# Patient Record
Sex: Male | Born: 1960 | Race: White | Hispanic: No | Marital: Married | Smoking: Never smoker
Health system: Southern US, Community
[De-identification: ages and names within clinical notes are randomized; demographics above are authoritative.]

## PROBLEM LIST (undated history)

## (undated) DIAGNOSIS — Z8546 Personal history of malignant neoplasm of prostate: Secondary | ICD-10-CM

## (undated) DIAGNOSIS — D582 Other hemoglobinopathies: Secondary | ICD-10-CM

## (undated) DIAGNOSIS — E7800 Pure hypercholesterolemia, unspecified: Secondary | ICD-10-CM

## (undated) DIAGNOSIS — R002 Palpitations: Secondary | ICD-10-CM

## (undated) HISTORY — DX: Palpitations: R00.2

## (undated) HISTORY — DX: Pure hypercholesterolemia, unspecified: E78.00

## (undated) HISTORY — PX: KIDNEY STONE SURGERY: SHX686

## (undated) HISTORY — DX: Other hemoglobinopathies: D58.2

## (undated) HISTORY — DX: Personal history of malignant neoplasm of prostate: Z85.46

---

## 2019-10-12 ENCOUNTER — Emergency Department
Admission: EM | Admit: 2019-10-12 | Discharge: 2019-10-12 | Disposition: A | Discharge status: Home / Self Care | Payer: BC Managed Care – PPO | Source: Home / Self Care

## 2019-10-12 ENCOUNTER — Emergency Department (INDEPENDENT_AMBULATORY_CARE_PROVIDER_SITE_OTHER): Payer: BC Managed Care – PPO

## 2019-10-12 DIAGNOSIS — S82832A Other fracture of upper and lower end of left fibula, initial encounter for closed fracture: Secondary | ICD-10-CM

## 2019-10-12 DIAGNOSIS — W1843XA Slipping, tripping and stumbling without falling due to stepping from one level to another, initial encounter: Secondary | ICD-10-CM | POA: Diagnosis not present

## 2019-10-12 NOTE — Discharge Instructions (Signed)
You should wear the boot at all times and remain non-weightbearing using the crutches.  You can briefly remove the boot while relaxing at home, elevating foot and applying a cool compress to help with pain and swelling. And you can remove the boot to bath, do no try to stand as you will risk falling and causing more injury to your ankle.  Call today or tomorrow to schedule an appointment with Sports Medicine next week for further evaluation and treatment of symptoms.

## 2019-10-12 NOTE — ED Provider Notes (Signed)
[facility]    CSN: [id number] Arrival date & time: 10/12/19  1406      History   Chief Complaint Chief Complaint  Patient presents with  . Ankle Pain    LT    HPI Travis Mueller is a 59 y.o. male.   HPI Travis Mueller is a 59 y.o. male presenting to UC with c/o sudden onset Left ankle pain and swelling that started just PTA while mis stepping over a curb while at work at end of shift today. Pt works for [employer].  He has been there about 2 weeks. He notified work but states they did not give him any worker's comp paperwork.  Pain is aching and sore, 6/10, worse with weightbearing and movement. No prior fracture or surgery to same ankle. No other injuries from the stumble.   History reviewed. No pertinent past medical history.  There are no problems to display for this patient.   History reviewed. No pertinent surgical history.     Home Medications    Prior to Admission medications   Medication Sig Start Date End Date Taking? Authorizing Provider  omeprazole (PRILOSEC) 20 MG capsule Take 20 mg by mouth daily.   Yes [provider]  simvastatin (ZOCOR) 20 MG tablet Take 20 mg by mouth daily.   Yes [provider]    Family History History reviewed. No pertinent family history.  Social History Social History   Tobacco Use  . Smoking status: Never Smoker  . Smokeless tobacco: Never Used  Vaping Use  . Vaping Use: Never used  Substance Use Topics  . Alcohol use: Not Currently  . Drug use: Not on file     Allergies   Patient has no known allergies.   Review of Systems Review of Systems  Musculoskeletal: Positive for arthralgias, gait problem and joint swelling.  Skin: Negative for color change and wound.  Neurological: Negative for weakness and numbness.     Physical Exam Triage Vital Signs ED Triage Vitals  Enc Vitals Group     BP 10/12/19 1422 (!) 143/76     Pulse Rate 10/12/19 1422 81     Resp 10/12/19 1422 17      Temp 10/12/19 1422 99 F (37.2 C)     Temp Source 10/12/19 1422 Oral     SpO2 10/12/19 1422 97 %     Weight --      Height --      Head Circumference --      Peak Flow --      Pain Score 10/12/19 1423 6     Pain Loc --      Pain Edu? --      Excl. in GC? --    No data found.  Updated Vital Signs BP (!) 143/76 (BP Location: Left Arm)   Pulse 81   Temp 99 F (37.2 C) (Oral)   Resp 17   SpO2 97%   Visual Acuity Right Eye Distance:   Left Eye Distance:   Bilateral Distance:    Right Eye Near:   Left Eye Near:    Bilateral Near:     Physical Exam Vitals and nursing note reviewed.  Constitutional:      Appearance: Normal appearance. He is well-developed.  HENT:     Head: Normocephalic and atraumatic.  Cardiovascular:     Rate and Rhythm: Normal rate and regular rhythm.     Pulses:          Dorsalis pedis  pulses are 2+ on the left side.       Posterior tibial pulses are 2+ on the left side.  Pulmonary:     Effort: Pulmonary effort is normal.  Musculoskeletal:        General: Swelling and tenderness present.     Cervical back: Normal range of motion.     Comments: Left ankle: moderate edema over lateral malleolus. Tenderness. Slight decreased dorsiflexion and plantarflexion due to pain.  Calf is soft, non-tender, full ROM knee. No tenderness to foot.   Skin:    General: Skin is warm and dry.     Capillary Refill: Capillary refill takes less than 2 seconds.     Findings: No bruising or erythema.  Neurological:     Mental Status: He is alert and oriented to person, place, and time.     Sensory: No sensory deficit.  Psychiatric:        Behavior: Behavior normal.      UC Treatments / Results  Labs (all labs ordered are listed, but only abnormal results are displayed) Labs Reviewed - No data to display  EKG   Radiology DG Ankle Complete Left  Result Date: 10/12/2019 CLINICAL DATA:  59 year old male twisted left ankle today at work. Complains of  lateral ankle pain. EXAM: LEFT ANKLE COMPLETE - 3+ VIEW COMPARISON:  None. FINDINGS: Transsyndesmotic, oblique, minimally displaced fracture of the distal fibula. No other acute fracture of the bones of the left ankle. No dislocation. No widening of the medial or lateral clear spaces. Small associated left ankle effusion. Associated subcutaneus soft tissue edema of the lateral left ankle. IMPRESSION: Transsyndesmotic, oblique, minimally displaced fracture of the distal fibula. Electronically Signed   By: Tish Frederickson M.D.   On: 10/12/2019 15:19    Procedures Procedures (including critical care time)  Medications Ordered in UC Medications - No data to display  Initial Impression / Assessment and Plan / UC Course  I have reviewed the triage vital signs and the nursing notes.  Pertinent labs & imaging results that were available during my care of the patient were reviewed by me and considered in my medical decision making (see chart for details).     Discussed imaging with pt  Pt placed in walking boot and provided crutches, advised to remain non-weightbearing until f/u with sports medicine next week AVS and work note provided  Final Clinical Impressions(s) / UC Diagnoses   Final diagnoses:  Other closed fracture of distal end of left fibula, initial encounter     Discharge Instructions     You should wear the boot at all times and remain non-weightbearing using the crutches.  You can briefly remove the boot while relaxing at home, elevating foot and applying a cool compress to help with pain and swelling. And you can remove the boot to bath, do no try to stand as you will risk falling and causing more injury to your ankle.  Call today or tomorrow to schedule an appointment with Sports Medicine next week for further evaluation and treatment of symptoms.     ED Prescriptions    None     PDMP not reviewed this encounter.   Lurene Shadow, [place] 10/12/19 1558

## 2019-10-12 NOTE — ED Triage Notes (Signed)
Pt c/o LT ankle pain after misstepping at end of shift today. Swelling and bruising noted. Advil an hour ago.

## 2019-12-12 DIAGNOSIS — E782 Mixed hyperlipidemia: Secondary | ICD-10-CM | POA: Insufficient documentation

## 2020-02-08 ENCOUNTER — Other Ambulatory Visit: Payer: BC Managed Care – PPO

## 2020-02-08 DIAGNOSIS — Z20822 Contact with and (suspected) exposure to covid-19: Secondary | ICD-10-CM

## 2020-02-13 LAB — NOVEL CORONAVIRUS, NAA: SARS-CoV-2, NAA: DETECTED — AB

## 2021-01-25 NOTE — Progress Notes (Signed)
Cardiology Office Note:    Date:  01/28/2021   ID:  Travis Mueller, DOB October 15, 1960, MRN [id number]  PCP:  Oneita Hurt No   [facility] Providers Cardiologist:  Alverda Skeans, MD Referring MD: Shon Hale, *   Chief Complaint/Reason for Referral: Palpitations  ASSESSMENT:    Palpitations  Syncope and collapse    PLAN:    In order of problems listed above:  1.  We will obtain echocardiogram and monitor to evaluate further.  We will keep follow-up open-ended depending on the results of this testing.  It is possible that given his nausea this may have represented a vasovagal episode.  2.  The fact that the patient had nausea associated with this episode makes me suspicious of vasovagal syncope.  We will complete the work-up as above and if this is negative then I think this is likely the diagnosis.     Dispo:  No follow-ups on file.     Medication Adjustments/Labs and Tests Ordered: Current medicines are reviewed at length with the patient today.  Concerns regarding medicines are outlined above.   Tests Ordered: No orders of the defined types were placed in this encounter.   Medication Changes: No orders of the defined types were placed in this encounter.   History of Present Illness:    The patient is a 60 y.o. male with the indicated medical history here for recommendations regarding palpitations.  The patient was seen by his primary care provider recently.  Apparently had an episode of syncope with complete loss of consciousness about 3 weeks ago.  He had preceding nausea as well as palpitations associated with this episode.  His primary care provider asked him to monitor his rhythm on his apple watch.  Additionally labs were drawn but I do not have those results.  The patient tells me that on the day of his syncope he was going to the bathroom because of the nausea.  He then blacked out.  He hit his head on the toilet.  He then came to and as he was walking he  had another episode of syncope.  He did not seek urgent medical attention.  He works at [employer] and does quite strenuous labor by lifting boxes.  He denies any significant angina.  He occasionally gets short of breath but this is not on a regular basis.  He denies any paroxysmal nocturnal dyspnea, orthopnea, other episodes of syncope recently or peripheral edema.  He has had no bleeding or bruising or any signs or symptoms of stroke.    Previous Medical History: Past Medical History:  Diagnosis Date   Elevated hemoglobin (HCC)    History of prostate cancer    Hypercholesterolemia    Palpitations      Current Medications: Current Meds  Medication Sig   omeprazole (PRILOSEC) 20 MG capsule Take 20 mg by mouth daily.   simvastatin (ZOCOR) 20 MG tablet Take 20 mg by mouth daily.     Allergies:    Patient has no known allergies.   Social History:   Social History   Tobacco Use   Smoking status: Never   Smokeless tobacco: Never  Vaping Use   Vaping Use: Never used  Substance Use Topics   Alcohol use: Not Currently     Family Hx: Family History  Problem Relation Age of Onset   Healthy Mother    Hypertension Father    Cancer - Prostate Father    Cancer - Prostate Maternal Aunt  Hypertension Maternal Aunt    Healthy Daughter    Healthy Son      Review of Systems:   Please see the history of present illness.    All other systems reviewed and are negative.  EKGs/Labs/Other Test Reviewed:    EKG:  EKG today: Sinus rhythm; prior EKG: None available  Prior CV studies: None available    Imaging studies that I have independently reviewed today: None available  Recent Labs: No results found for requested labs within last 8760 hours.   Recent Lipid Panel No results found for: CHOL, TRIG, HDL, LDLCALC, LDLDIRECT  Risk Assessment/Calculations:           Physical Exam:    VS:  BP 128/80    Pulse 75    Ht 5\' 6"  (1.676 m)    Wt 150 lb 3.2 oz (68.1 kg)    SpO2 98%     BMI 24.24 kg/m    Wt Readings from Last 3 Encounters:  01/28/21 150 lb 3.2 oz (68.1 kg)    GENERAL:  No apparent distress, AOx3 HEENT:  No carotid bruits, +2 carotid impulses, no scleral icterus CAR: RRR  no murmurs, gallops, rubs, or thrills RES:  Clear to auscultation bilaterally ABD:  Soft, nontender, nondistended, positive bowel sounds x 4 VASC:  +2 radial pulses, +2 carotid pulses, palpable pedal pulses NEURO:  CN 2-12 grossly intact; motor and sensory grossly intact PSYCH:  No active depression or anxiety EXT:  No edema, ecchymosis, or cyanosis  Signed, 03/28/21, MD  01/28/2021 3:46 PM    [facility] [address] [place], [place], [place]  [place] Phone: [phone]; Fax: [phone]   Note:  This document was prepared using Dragon voice recognition software and may include unintentional dictation errors.

## 2021-01-28 ENCOUNTER — Encounter: Payer: Self-pay | Admitting: Internal Medicine

## 2021-01-28 ENCOUNTER — Ambulatory Visit (INDEPENDENT_AMBULATORY_CARE_PROVIDER_SITE_OTHER): Payer: Managed Care, Other (non HMO) | Admitting: Internal Medicine

## 2021-01-28 ENCOUNTER — Ambulatory Visit (INDEPENDENT_AMBULATORY_CARE_PROVIDER_SITE_OTHER): Payer: Managed Care, Other (non HMO)

## 2021-01-28 ENCOUNTER — Other Ambulatory Visit: Payer: Self-pay

## 2021-01-28 VITALS — BP 128/80 | HR 75 | Ht 66.0 in | Wt 150.2 lb

## 2021-01-28 DIAGNOSIS — R002 Palpitations: Secondary | ICD-10-CM

## 2021-01-28 DIAGNOSIS — R55 Syncope and collapse: Secondary | ICD-10-CM | POA: Diagnosis not present

## 2021-01-28 NOTE — Patient Instructions (Signed)
Medication Instructions:  Your physician recommends that you continue on your current medications as directed. Please refer to the Current Medication list given to you today.  *If you need a refill on your cardiac medications before your next appointment, please call your pharmacy*   Lab Work: None today If you have labs (blood work) drawn today and your tests are completely normal, you will receive your results only by: MyChart Message (if you have MyChart) OR A paper copy in the mail If you have any lab test that is abnormal or we need to change your treatment, we will call you to review the results.   Testing/Procedures: Your physician has requested that you have an echocardiogram. Echocardiography is a painless test that uses sound waves to create images of your heart. It provides your doctor with information about the size and shape of your heart and how well your hearts chambers and valves are working. This procedure takes approximately one hour. There are no restrictions for this procedure.    Follow-Up: At [facility], you and your health needs are our priority.  As part of our continuing mission to provide you with exceptional heart care, we have created designated Provider Care Teams.  These Care Teams include your primary Cardiologist (physician) and Advanced Practice Providers (APPs -  Physician Assistants and Nurse Practitioners) who all work together to provide you with the care you need, when you need it.  We recommend signing up for the patient portal called "MyChart".  Sign up information is provided on this After Visit Summary.  MyChart is used to connect with patients for Virtual Visits (Telemedicine).  Patients are able to view lab/test results, encounter notes, upcoming appointments, etc.  Non-urgent messages can be sent to your provider as well.   To learn more about what you can do with MyChart, go to [email or web address].    Your next appointment:   Follow up  as needed.   Provider:   Orbie Pyo, MD  Other Instructions  Travis Mueller- Long Term Monitor Instructions  Your physician has requested you wear a ZIO patch monitor for 7 days.  This is a single patch monitor. [employer] supplies one patch monitor per enrollment. Additional stickers are not available. Please do not apply patch if you will be having a Nuclear Stress Test,  Echocardiogram, Cardiac CT, MRI, or Chest Xray during the period you would be wearing the  monitor. The patch cannot be worn during these tests. You cannot remove and re-apply the  ZIO XT patch monitor.  Your ZIO patch monitor will be mailed 3 day USPS to your address on file. It may take 3-5 days  to receive your monitor after you have been enrolled.  Once you have received your monitor, please review the enclosed instructions. Your monitor  has already been registered assigning a specific monitor serial # to you.  Billing and Patient Assistance Program Information  We have supplied [employer] with any of your insurance information on file for billing purposes. [employer] offers a sliding scale Patient Assistance Program for patients that do not have  insurance, or whose insurance does not completely cover the cost of the ZIO monitor.  You must apply for the Patient Assistance Program to qualify for this discounted rate.  To apply, please call [employer] at [phone], select option 4, select option 2, ask to apply for  Patient Assistance Program. Meredeth Ide will ask your household income, and how many people  are in your household. They will quote your  out-of-pocket cost based on that information.  [employer] will also be able to set up a 48-month, interest-free payment plan if needed.  Applying the monitor   Shave hair from upper left chest.  Hold abrader disc by orange tab. Rub abrader in 40 strokes over the upper left chest as  indicated in your monitor instructions.  Clean area with 4 enclosed alcohol pads. Let dry.  Apply  patch as indicated in monitor instructions. Patch will be placed under collarbone on left  side of chest with arrow pointing upward.  Rub patch adhesive wings for 2 minutes. Remove white label marked "1". Remove the white  label marked "2". Rub patch adhesive wings for 2 additional minutes.  While looking in a mirror, press and release button in center of patch. A small green light will  flash 3-4 times. This will be your only indicator that the monitor has been turned on.  Do not shower for the first 24 hours. You may shower after the first 24 hours.  Press the button if you feel a symptom. You will hear a small click. Record Date, Time and  Symptom in the Patient Logbook.  When you are ready to remove the patch, follow instructions on the last 2 pages of Patient  Logbook. Stick patch monitor onto the last page of Patient Logbook.  Place Patient Logbook in the blue and white box. Use locking tab on box and tape box closed  securely. The blue and white box has prepaid postage on it. Please place it in the mailbox as  soon as possible. Your physician should have your test results approximately 7 days after the  monitor has been mailed back to [facility].  Call [facility] Customer Care at [phone] if you have questions regarding  your ZIO XT patch monitor. Call them immediately if you see an orange light blinking on your  monitor.  If your monitor falls off in less than 4 days, contact our Monitor department at [phone].  If your monitor becomes loose or falls off after 4 days call [employer] at [phone] for  suggestions on securing your monitor

## 2021-01-28 NOTE — Progress Notes (Unsigned)
[id number] applied in office

## 2021-02-07 NOTE — Progress Notes (Signed)
Let him know monitor shows very rare extra beats from the top and bottom of the heart that were short in duration.  No worrisome rhythm issues.

## 2021-02-12 ENCOUNTER — Ambulatory Visit (HOSPITAL_COMMUNITY): Payer: Managed Care, Other (non HMO) | Attending: Cardiology

## 2021-02-12 ENCOUNTER — Other Ambulatory Visit: Payer: Self-pay

## 2021-02-12 DIAGNOSIS — R002 Palpitations: Secondary | ICD-10-CM | POA: Insufficient documentation

## 2021-02-12 DIAGNOSIS — R0609 Other forms of dyspnea: Secondary | ICD-10-CM

## 2021-02-12 DIAGNOSIS — R55 Syncope and collapse: Secondary | ICD-10-CM | POA: Diagnosis not present

## 2021-02-12 LAB — ECHOCARDIOGRAM COMPLETE
Area-P 1/2: 2.66 cm2
S' Lateral: 3.7 cm

## 2021-07-06 IMAGING — DX DG ANKLE COMPLETE 3+V*L*
3 series · 3 of 3 positions shown · non-contrast
Comparison: None.

CLINICAL DATA: 58-year-old male twisted left ankle today at work.
Complains of lateral ankle pain.

EXAM:
LEFT ANKLE COMPLETE - 3+ VIEW

[ankle ap]
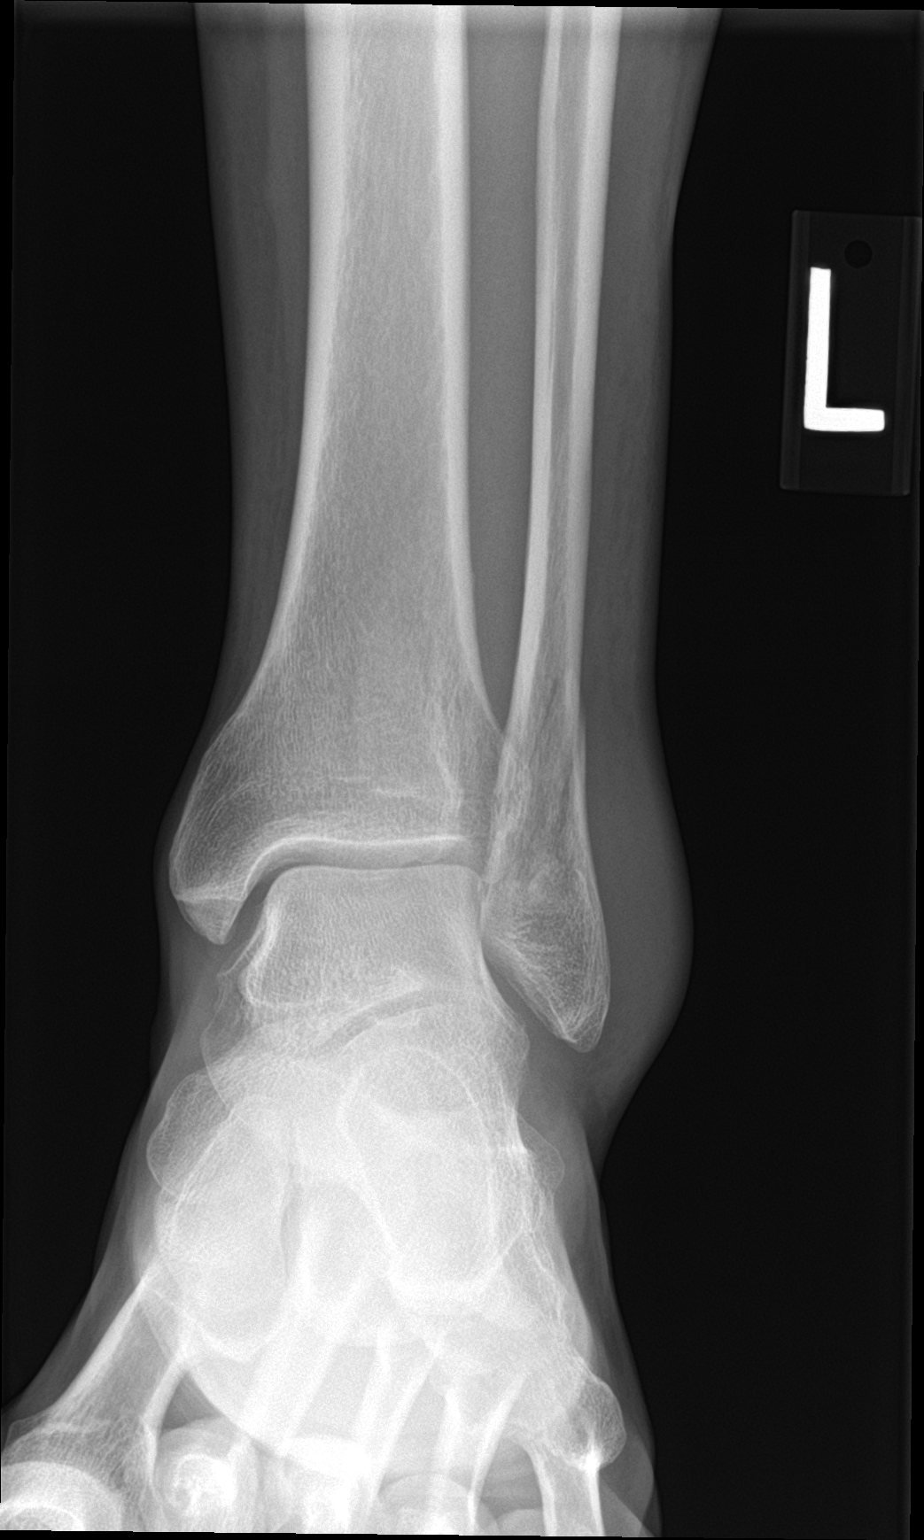

[ankle obl]
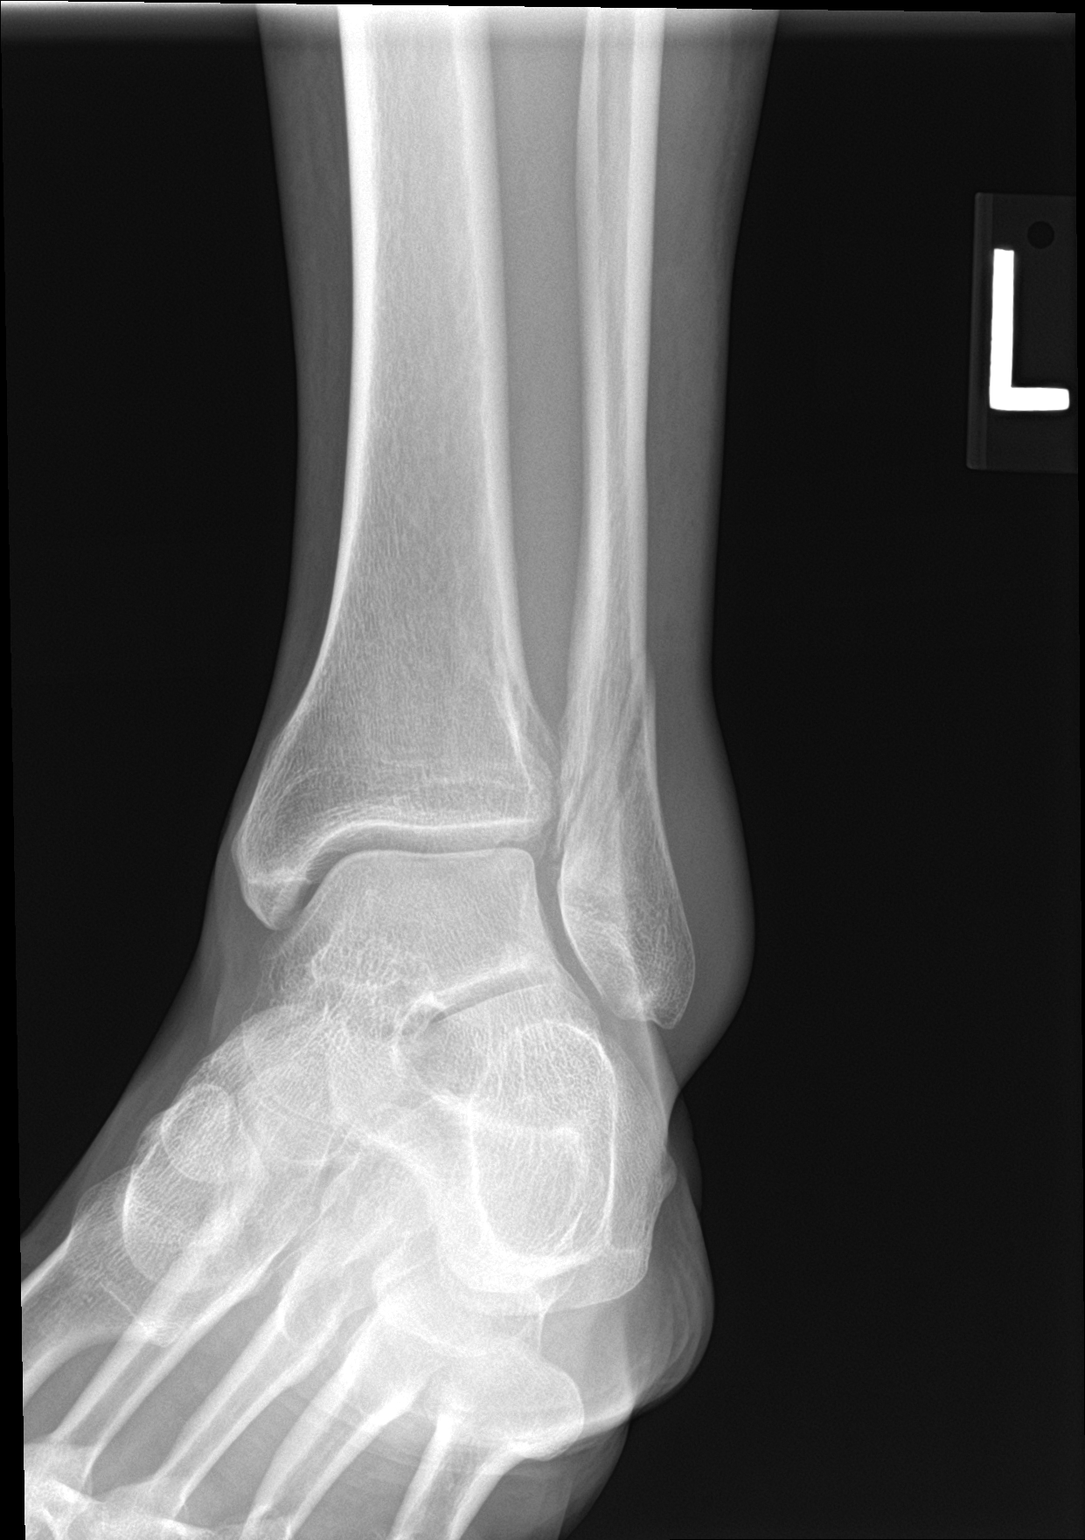

[ankle lat]
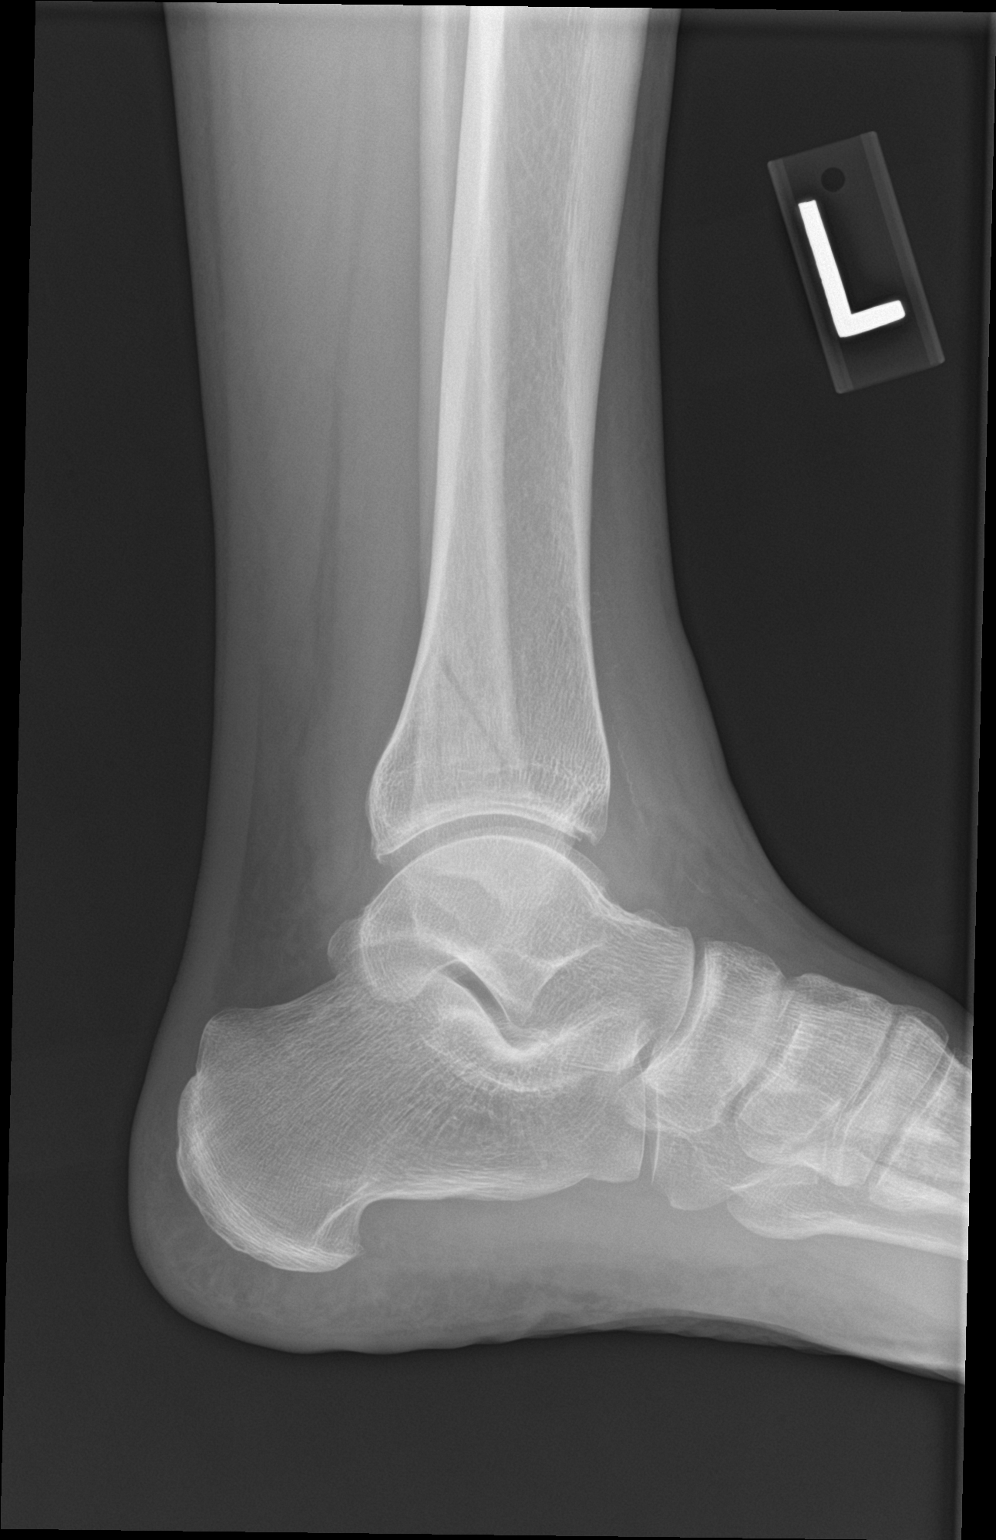

[3 of 3 positions shown; findings below may reference images not displayed]

FINDINGS: Transsyndesmotic, oblique, minimally displaced fracture of the
distal fibula. No other acute fracture of the bones of the left
ankle. No dislocation. No widening of the medial or lateral clear
spaces. Small associated left ankle effusion. Associated subcutaneus
soft tissue edema of the lateral left ankle.
IMPRESSION: Transsyndesmotic, oblique, minimally displaced fracture of the
distal fibula.

## 2022-01-30 ENCOUNTER — Other Ambulatory Visit (HOSPITAL_COMMUNITY): Payer: Self-pay | Admitting: Family Medicine

## 2022-01-30 DIAGNOSIS — E785 Hyperlipidemia, unspecified: Secondary | ICD-10-CM

## 2022-02-04 ENCOUNTER — Ambulatory Visit (HOSPITAL_BASED_OUTPATIENT_CLINIC_OR_DEPARTMENT_OTHER)
Admission: RE | Admit: 2022-02-04 | Discharge: 2022-02-04 | Disposition: A | Discharge status: Home / Self Care | Payer: Self-pay | Source: Ambulatory Visit | Attending: Family Medicine | Admitting: Family Medicine

## 2022-02-04 DIAGNOSIS — E785 Hyperlipidemia, unspecified: Secondary | ICD-10-CM | POA: Insufficient documentation

## 2022-02-06 ENCOUNTER — Other Ambulatory Visit: Payer: Self-pay | Admitting: Family Medicine

## 2022-02-06 DIAGNOSIS — K769 Liver disease, unspecified: Secondary | ICD-10-CM

## 2022-02-11 NOTE — Progress Notes (Addendum)
Cardiology Office Note:    Date:  02/12/2022   ID:  Travis Mueller, DOB [birth year], MRN [id number]  PCP:  Aviva Kluver   [facility] Providers Cardiologist:  Alverda Skeans, MD Referring MD: Shon Hale, *   Chief Complaint/Reason for Referral:  Coronary calcium  ASSESSMENT:    1. Coronary artery calcification seen on CAT scan   2. Hyperlipidemia LDL goal <70     PLAN:    In order of problems listed above:  Coronary calcification:  Start ASA 81mg , continue atorvastatin 80 mg and start zetia 10 mg, and pursue strict blood pressure control. Hyperlipidemia:  Check FLP, LFTs, and Lp(a) in 2 months.             Dispo:  Return in about 1 year (around 02/13/2023).      Medication Adjustments/Labs and Tests Ordered: Current medicines are reviewed at length with the patient today.  Concerns regarding medicines are outlined above.  The following changes have been made:     Labs/tests ordered: Orders Placed This Encounter  Procedures   ALT   Lipid panel   Lipoprotein A (LPA)   EKG 12-Lead    Medication Changes: Meds ordered this encounter  Medications   aspirin EC 81 MG tablet    Sig: Take 1 tablet (81 mg total) by mouth daily. Swallow whole.    Dispense:  30 tablet    Refill:  12     Current medicines are reviewed at length with the patient today.  The patient does not have concerns regarding medicines.   History of Present Illness:    FOCUSED PROBLEM LIST:   Coronary calcification on CAC 2024  The patient is a 62 y.o. male with the indicated medical history here for recommendations regarding coronary calcium seen on a screening CAC.  The patient had been seen a year ago for palpitations and had a reassuring echocardiogram and monitor.  He had a calcium score done which showed elevated amount of calcium.  He is here to discuss those results.  He works at [employer] and is quite active.  He walks quite a bit every day.  He denies any exertional angina,  exertional dyspnea, recent presyncope or syncope, palpitations, paroxysmal nocturnal dyspnea, orthopnea.  He recently had his atorvastatin increased to 80 mg by his PCP and was started on aspirin 81 mg.  He is otherwise well without complaints.          Current Medications: Current Meds  Medication Sig   aspirin EC 81 MG tablet Take 1 tablet (81 mg total) by mouth daily. Swallow whole.   atorvastatin (LIPITOR) 80 MG tablet Take 80 mg by mouth daily.   omeprazole (PRILOSEC) 20 MG capsule Take 20 mg by mouth daily.     Allergies:    Patient has no known allergies.   Social History:   Social History   Tobacco Use   Smoking status: Never   Smokeless tobacco: Never  Vaping Use   Vaping Use: Never used  Substance Use Topics   Alcohol use: Not Currently     Family Hx: Family History  Problem Relation Age of Onset   Healthy Mother    Hypertension Father    Cancer - Prostate Father    Cancer - Prostate Maternal Aunt    Hypertension Maternal Aunt    Healthy Daughter    Healthy Son      Review of Systems:   Please see the history of present illness.    All  other systems reviewed and are negative.     EKGs/Labs/Other Test Reviewed:    EKG:  EKG performed 2023 that I personally reviewed demonstrates sinus rhythm; EKG performed today that I personally reviewed demonstrates normal sinus rhythm.  Prior CV studies:  CAC 2024: 1. Coronary calcium score of 597. This was 88th percentile for age-, race-, and sex-matched controls.  TTE 2023: 1. Left ventricular ejection fraction, by estimation, is 55%. The left  ventricle has normal function. The left ventricle has no regional wall  motion abnormalities. Left ventricular diastolic parameters were normal.   2. Right ventricular systolic function is normal. The right ventricular  size is normal. There is normal pulmonary artery systolic pressure. The  estimated right ventricular systolic pressure is 22.0 mmHg.   3. The mitral  valve is normal in structure. Trivial mitral valve  regurgitation. No evidence of mitral stenosis.   4. The aortic valve is tricuspid. Aortic valve regurgitation is not  visualized. No aortic stenosis is present.   5. The inferior vena cava is normal in size with greater than 50%  respiratory variability, suggesting right atrial pressure of 3 mmHg.   Monitor 2023: EVENTS: 1 run of Ventricular Tachycardia occurred lasting 4 beats with a max rate of 122 bpm (avg 116 bpm).   2 Supraventricular Tachycardia runs occurred, the run with the fastest interval lasting 5 beats with a max rate of 162 bpm (avg 153 bpm); the run with the fastest interval was also the longest. Isolated SVEs were rare (<1.0%), SVE Couplets were rare (<1.0%), and SVE Triplets were rare (<1.0%). Isolated VEs were rare (<1.0%), VE Couplets were rare (<1.0%), and no VE Triplets were present.    No atrial fibrillation, ventricular tachyarrhythmias, or bradyarrhythmias were detected.  Other studies Reviewed: Review of the additional studies/records demonstrates: No imaging demonstrating aortic atherosclerosis  Recent Labs: No results found for requested labs within last 365 days.   Recent Lipid Panel No results found for: "CHOL", "TRIG", "HDL", "LDLCALC", "LDLDIRECT"  Risk Assessment/Calculations:                Physical Exam:    VS:  BP 110/66   Pulse 76   Ht 5\' 6"  (1.676 m)   Wt 147 lb 12.8 oz (67 kg)   SpO2 97%   BMI 23.86 kg/m    Wt Readings from Last 3 Encounters:  02/12/22 147 lb 12.8 oz (67 kg)  01/28/21 150 lb 3.2 oz (68.1 kg)    GENERAL:  No apparent distress, AOx3 HEENT:  No carotid bruits, +2 carotid impulses, no scleral icterus CAR: RRR no murmurs, gallops, rubs, or thrills RES:  Clear to auscultation bilaterally ABD:  Soft, nontender, nondistended, positive bowel sounds x 4 VASC:  +2 radial pulses, +2 carotid pulses, palpable pedal pulses NEURO:  CN 2-12 grossly intact; motor and sensory  grossly intact PSYCH:  No active depression or anxiety EXT:  No edema, ecchymosis, or cyanosis  Signed, Orbie Pyo, MD  02/12/2022 2:23 PM    [facility] [address] [place], [place], [place]  [address] Phone: [phone]; Fax: [phone]   Note:  This document was prepared using Dragon voice recognition software and may include unintentional dictation errors.

## 2022-02-12 ENCOUNTER — Ambulatory Visit: Payer: 59 | Attending: Internal Medicine | Admitting: Internal Medicine

## 2022-02-12 ENCOUNTER — Encounter: Payer: Self-pay | Admitting: Internal Medicine

## 2022-02-12 VITALS — BP 110/66 | HR 76 | Ht 66.0 in | Wt 147.8 lb

## 2022-02-12 DIAGNOSIS — I2510 Atherosclerotic heart disease of native coronary artery without angina pectoris: Secondary | ICD-10-CM

## 2022-02-12 DIAGNOSIS — E785 Hyperlipidemia, unspecified: Secondary | ICD-10-CM | POA: Diagnosis not present

## 2022-02-12 MED ORDER — ASPIRIN 81 MG PO TBEC: 81.0000 mg | DELAYED_RELEASE_TABLET | Freq: Every day | ORAL | 12 refills | Status: DC

## 2022-02-12 NOTE — Patient Instructions (Signed)
Medication Instructions:  Your physician has recommended you make the following change in your medication:  START: Aspirin 81 mg by mouth once daily  *If you need a refill on your cardiac medications before your next appointment, please call your pharmacy*   Lab Work: IN 2 MONTHS: Fasting lipid panel and ALT (nothing to eat or drink 8-12 hours before except water and black coffee)  If you have labs (blood work) drawn today and your tests are completely normal, you will receive your results only by: [place] (if you have MyChart) OR A paper copy in the mail If you have any lab test that is abnormal or we need to change your treatment, we will call you to review the results.   Testing/Procedures: NONE   Follow-Up: At [facility], you and your health needs are our priority.  As part of our continuing mission to provide you with exceptional heart care, we have created designated Provider Care Teams.  These Care Teams include your primary Cardiologist (physician) and Advanced Practice Providers (APPs -  Physician Assistants and Nurse Practitioners) who all work together to provide you with the care you need, when you need it.  We recommend signing up for the patient portal called "MyChart".  Sign up information is provided on this After Visit Summary.  MyChart is used to connect with patients for Virtual Visits (Telemedicine).  Patients are able to view lab/test results, encounter notes, upcoming appointments, etc.  Non-urgent messages can be sent to your provider as well.   To learn more about what you can do with MyChart, go to [email or web address].    Your next appointment:   1 year(s)  Provider:   Nicholes Rough, PA-C, Ambrose Pancoast, NP, or Richardson Dopp, PA-C

## 2022-02-25 ENCOUNTER — Other Ambulatory Visit: Payer: Managed Care, Other (non HMO)

## 2022-03-10 ENCOUNTER — Ambulatory Visit
Admission: RE | Admit: 2022-03-10 | Discharge: 2022-03-10 | Disposition: A | Discharge status: Home / Self Care | Payer: 59 | Source: Ambulatory Visit | Attending: Family Medicine | Admitting: Family Medicine

## 2022-03-10 DIAGNOSIS — K769 Liver disease, unspecified: Secondary | ICD-10-CM

## 2022-04-15 ENCOUNTER — Ambulatory Visit: Payer: 59 | Attending: Internal Medicine

## 2022-04-15 DIAGNOSIS — E785 Hyperlipidemia, unspecified: Secondary | ICD-10-CM

## 2022-04-15 DIAGNOSIS — I2510 Atherosclerotic heart disease of native coronary artery without angina pectoris: Secondary | ICD-10-CM

## 2022-04-17 ENCOUNTER — Telehealth: Payer: Self-pay

## 2022-04-17 DIAGNOSIS — E785 Hyperlipidemia, unspecified: Secondary | ICD-10-CM

## 2022-04-17 DIAGNOSIS — I2510 Atherosclerotic heart disease of native coronary artery without angina pectoris: Secondary | ICD-10-CM

## 2022-04-17 LAB — LIPID PANEL
Chol/HDL Ratio: 3.5 ratio (ref 0.0–5.0)
Cholesterol, Total: 154 mg/dL (ref 100–199)
HDL: 44 mg/dL (ref >39)
LDL Chol Calc (NIH): 92 mg/dL (ref 0–99)
Triglycerides: 96 mg/dL (ref 0–149)
VLDL Cholesterol Cal: 18 mg/dL (ref 5–40)

## 2022-04-17 LAB — LIPOPROTEIN A (LPA): Lipoprotein (a): 290.7 nmol/L — ABNORMAL HIGH (ref <75.0)

## 2022-04-17 LAB — ALT: ALT: 25 IU/L (ref 0–44)

## 2022-04-17 MED ORDER — EZETIMIBE 10 MG PO TABS: 10.0000 mg | ORAL_TABLET | Freq: Every day | ORAL | 3 refills | Status: DC

## 2022-04-17 NOTE — Telephone Encounter (Signed)
-----   Message from Early Osmond, MD sent at 04/17/2022  7:48 AM EDT ----- Start Zetia 10 mg and check lipid panel and LFTs in 2 months.

## 2022-04-17 NOTE — Telephone Encounter (Signed)
The patient has been notified of the result and verbalized understanding.  All questions (if any) were answered. Bernestine Amass, RN 04/17/2022 2:17 PM  Zetia has been sent in. Lab work has been scheduled.

## 2022-06-09 ENCOUNTER — Ambulatory Visit: Payer: 59 | Attending: Internal Medicine

## 2022-06-09 ENCOUNTER — Ambulatory Visit: Payer: 59

## 2022-06-09 DIAGNOSIS — E785 Hyperlipidemia, unspecified: Secondary | ICD-10-CM

## 2022-06-09 DIAGNOSIS — I2510 Atherosclerotic heart disease of native coronary artery without angina pectoris: Secondary | ICD-10-CM

## 2022-06-10 ENCOUNTER — Telehealth: Payer: Self-pay

## 2022-06-10 DIAGNOSIS — E785 Hyperlipidemia, unspecified: Secondary | ICD-10-CM

## 2022-06-10 LAB — LIPID PANEL
Chol/HDL Ratio: 3 ratio (ref 0.0–5.0)
Cholesterol, Total: 135 mg/dL (ref 100–199)
HDL: 45 mg/dL (ref >39)
LDL Chol Calc (NIH): 79 mg/dL (ref 0–99)
Triglycerides: 52 mg/dL (ref 0–149)
VLDL Cholesterol Cal: 11 mg/dL (ref 5–40)

## 2022-06-10 LAB — ALT: ALT: 44 IU/L (ref 0–44)

## 2022-06-10 NOTE — Telephone Encounter (Signed)
I called patient to give lab results and give him provider recommendations ands he stated he had concerns about his atorvastatin. Patient states ever since he started on 80 mg of his Atorvastatin he has been breaking out in a rash. He states he did not try any new foods or change laundry detergents. I did advise patient to hold atorvastatin until he meets with pharm D. Referral placed for lipid clinic

## 2022-06-10 NOTE — Telephone Encounter (Signed)
-----   Message from Orbie Pyo, MD sent at 06/10/2022  7:11 AM EDT ----- Please refer to pharm D for lipid management.

## 2022-06-11 ENCOUNTER — Ambulatory Visit: Payer: 59 | Attending: Interventional Cardiology | Admitting: Pharmacist

## 2022-06-11 DIAGNOSIS — E782 Mixed hyperlipidemia: Secondary | ICD-10-CM

## 2022-06-11 NOTE — Progress Notes (Signed)
Patient ID: Travis Mueller                 DOB: 1960-02-03                    MRN: [id number]      HPI: Travis Mueller is a 62 y.o. male patient referred to lipid clinic by Dr. Lynnette Caffey. 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  In March his LDL- C was 92 on atorvastatin 80mg  daily. Zetia 10mg  daily was added. Repeat LDL-C was 79. He reported a rash ever since his atorvastatin was increased to 80mg  daily. He was advised to hold atorvastatin.  Patient presents today to Pharm.D. clinic.  States a took his last dose of atorvastatin last night.  He states that he had some rash with atorvastatin 40 mg but it got worse when it was increased to 80 mg.  Denies any change in detergent or soap.  Currently on [employer].  Calcium score 597 however patient has been on statins for almost 20 years which may have affected his score slightly.  Previously on simvastatin and was changed to atorvastatin.  He used to paint cars for living, but now concerned about its effects on his liver.  There is a spot they are watching.  He now works for [employer] and pushes carts and lifts heavy things all day.   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  We discussed how this is a genetic risk factor I cannot be overcome with lifestyle.  Reviewed options for lowering LDL cholesterol, including changing to rosuvastatin, PCSK-9 inhibitors and inclisiran.  Discussed mechanisms of action, dosing, side effects and potential decreases in LDL cholesterol.  Also reviewed cost information and potential options for patient assistance.   Current Medications: ezetimibe 10mg  daily Intolerances: atorvastatin 80mg  (rash)? Risk Factors: elevated CAC score, elevated LP(a) LDL-C goal: <55 ApoB goal: <70  Diet: fish  Exercise: works for [employer], lifts  Family History: mother and father have high cholesterol  Social History: rare ETOH, no tobacco  Labs: Lipid Panel     Component Value Date/Time   CHOL 135 06/09/2022 0854   TRIG  52 06/09/2022 0854   HDL 45 06/09/2022 0854   CHOLHDL 3.0 06/09/2022 0854   LDLCALC 79 06/09/2022 0854   LABVLDL 11 06/09/2022 0854    Past Medical History:  Diagnosis Date   Elevated hemoglobin (HCC)    History of prostate cancer    Hypercholesterolemia    Palpitations     Current Outpatient Medications on File Prior to Visit  Medication Sig Dispense Refill   aspirin EC 81 MG tablet Take 1 tablet (81 mg total) by mouth daily. Swallow whole. 30 tablet 12   ezetimibe (ZETIA) 10 MG tablet Take 1 tablet (10 mg total) by mouth daily. 90 tablet 3   omeprazole (PRILOSEC) 20 MG capsule Take 20 mg by mouth daily.     No current facility-administered medications on file prior to visit.    Allergies  Allergen Reactions   Atorvastatin Rash    Assessment/Plan:  1. Hyperlipidemia -  Mixed hyperlipidemia Assessment: LDL-C was above goal of less than 55 on atorvastatin 80 and ezetimibe Timing of rash seems suspicious to be from atorvastatin especially since it got worse with the increase in dose.  Rash was present prior to the addition of ezetimibe We discussed how PCSK9 and inclisiran lower LP(a).  Discussed that we do not know if this 20-ish percent reduction is enough to be clinically significant.  However there is data from the Fourier trial that does with the LP(a) above 120 at a greater risk reduction than those with an LP(a) less than 120 Patient is concerned about cost, we did discuss manufacture co-pay cards that would be available while he is still on commercial insurance I did discuss that I do think it would be safe to rechallenge with a different statin  Plan: Stop atorvastatin Patient may treat rash with antihistamine and hydrocortisone cream.  He should reach out to his PCP if rash does not improve Patient to contact me or I will contact patient in 2 weeks for follow-up on his rash has resolved Once rash has resolved we will plan to either rechallenge with rosuvastatin or  go with alternative statin option such as Repatha or Leqvio It is possible that if we start rosuvastatin that his LDL-C will still not be at goal, however I think it is reasonable to trial this and then discuss add-on options at that time Continue ezetimibe   Thank you,  Olene Floss, Pharm.D, BCPS, CPP [facility] A [facility] of [facility] [address], [place], [place] [address]  Phone: [phone]; Fax: [phone]

## 2022-06-11 NOTE — Patient Instructions (Signed)
Stop taking atorvastatin Call me in 2 weeks to let me know if the rash has resolved You may take clartin/allegra and use hydrocortisone cream   Medication options include Try rosuvastatin  Try Repatha or Leqvio (injections that will also lower LP(a) some)  Call me at [phone] with any questions.

## 2022-06-11 NOTE — Assessment & Plan Note (Signed)
Assessment: LDL-C was above goal of less than 55 on atorvastatin 80 and ezetimibe Timing of rash seems suspicious to be from atorvastatin especially since it got worse with the increase in dose.  Rash was present prior to the addition of ezetimibe We discussed how PCSK9 and inclisiran lower LP(a).  Discussed that we do not know if this 20-ish percent reduction is enough to be clinically significant.  However there is data from the Fourier trial that does with the LP(a) above 120 at a greater risk reduction than those with an LP(a) less than 120 Patient is concerned about cost, we did discuss manufacture co-pay cards that would be available while he is still on commercial insurance I did discuss that I do think it would be safe to rechallenge with a different statin  Plan: Stop atorvastatin Patient may treat rash with antihistamine and hydrocortisone cream.  He should reach out to his PCP if rash does not improve Patient to contact me or I will contact patient in 2 weeks for follow-up on his rash has resolved Once rash has resolved we will plan to either rechallenge with rosuvastatin or go with alternative statin option such as Repatha or Leqvio It is possible that if we start rosuvastatin that his LDL-C will still not be at goal, however I think it is reasonable to trial this and then discuss add-on options at that time Continue ezetimibe

## 2022-06-23 ENCOUNTER — Telehealth: Payer: Self-pay | Admitting: Pharmacist

## 2022-06-23 DIAGNOSIS — E782 Mixed hyperlipidemia: Secondary | ICD-10-CM

## 2022-06-23 MED ORDER — ROSUVASTATIN CALCIUM 20 MG PO TABS: 20.0000 mg | ORAL_TABLET | Freq: Every day | ORAL | 3 refills | Status: DC

## 2022-06-23 NOTE — Telephone Encounter (Signed)
Spoke with patient. States that his rash is better, but not completely gone. Has not gotten in to to see PCP yet. Would like to try a different statin. Rx for rosuvastatin 20mg  daily sent in. Advised to call if rash gets worse. Scheduled for repeat labs 8/14.

## 2022-09-09 ENCOUNTER — Ambulatory Visit: Payer: 59 | Attending: Internal Medicine

## 2022-09-09 DIAGNOSIS — E782 Mixed hyperlipidemia: Secondary | ICD-10-CM

## 2022-09-09 LAB — HEPATIC FUNCTION PANEL
ALT: 86 IU/L — ABNORMAL HIGH (ref 0–44)
AST: 50 IU/L — ABNORMAL HIGH (ref 0–40)
Albumin: 4.6 g/dL (ref 3.9–4.9)
Alkaline Phosphatase: 82 IU/L (ref 44–121)
Bilirubin Total: 0.4 mg/dL (ref 0.0–1.2)
Bilirubin, Direct: 0.11 mg/dL (ref 0.00–0.40)
Total Protein: 6.9 g/dL (ref 6.0–8.5)

## 2022-09-09 LAB — LIPID PANEL
Chol/HDL Ratio: 3.3 ratio (ref 0.0–5.0)
Cholesterol, Total: 145 mg/dL (ref 100–199)
HDL: 44 mg/dL (ref >39)
LDL Chol Calc (NIH): 82 mg/dL (ref 0–99)
Triglycerides: 102 mg/dL (ref 0–149)
VLDL Cholesterol Cal: 19 mg/dL (ref 5–40)

## 2022-09-10 ENCOUNTER — Other Ambulatory Visit (HOSPITAL_COMMUNITY): Payer: Self-pay

## 2022-09-10 ENCOUNTER — Telehealth: Payer: Self-pay

## 2022-09-10 ENCOUNTER — Telehealth: Payer: Self-pay | Admitting: Pharmacist

## 2022-09-10 DIAGNOSIS — E782 Mixed hyperlipidemia: Secondary | ICD-10-CM

## 2022-09-10 MED ORDER — REPATHA SURECLICK 140 MG/ML ~~LOC~~ SOAJ: 1.0000 mL | SUBCUTANEOUS | 11 refills | Status: DC

## 2022-09-10 NOTE — Addendum Note (Signed)
Addended by: Malena Peer D on: 09/10/2022 05:28 PM   Modules accepted: Orders

## 2022-09-10 NOTE — Telephone Encounter (Signed)
PA request has been Submitted. New Encounter created for follow up. For additional info see Pharmacy Prior Auth telephone encounter from 09/10/22.

## 2022-09-10 NOTE — Telephone Encounter (Signed)
Pharmacy Patient Advocate Encounter  Received notification from [facility] that Prior Authorization for REPATHA has been APPROVED from 09/10/22 to 03/13/23. Ran test claim, Copay is $24.99 ([facility] APPLIED A VOUCHER AUTOMATICALLY THAT PAID $85 TOWARDS CLAIM). This test claim was processed through [facility]- copay amounts may vary at other pharmacies due to pharmacy/plan contracts, or as the patient moves through the different stages of their insurance plan.

## 2022-09-10 NOTE — Telephone Encounter (Addendum)
Called patient to review his labs. 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 LFT have increased. He states he has been taking 1 tylenol about every other day for the last few days. No ETOH.  His ALT was normal when on atorvastatin and zetia which makes me think its not from zetia. Will decrease rosuvastatin to 10mg  daily. He will split his tablets in half. We discussed PCKS9i. Although patient hesitant, he is agreeable. Will have tech team submit PA.  Recheck LFT in 1 month- need to set this apt up We discussed that he can possibly stop zetia. Will keep for now.  Baseline LDL-C 215 Rash improved off atorvastatin

## 2022-09-10 NOTE — Telephone Encounter (Signed)
Patient made aware of approval. Set up for LFT in 4 weeks. Advised to get copay card. Rx sent to pharmacy.

## 2022-09-10 NOTE — Telephone Encounter (Signed)
Pharmacy Patient Advocate Encounter   Received notification from Physician's Office that prior authorization for REPATHA is required/requested.   Insurance verification completed.   The patient is insured through [facility] .   Per test claim: PA required; PA submitted to [facility] via CoverMyMeds Key/confirmation #/EOC [id number] Status is pending

## 2022-10-14 ENCOUNTER — Ambulatory Visit: Payer: 59 | Attending: Internal Medicine

## 2022-10-14 DIAGNOSIS — E782 Mixed hyperlipidemia: Secondary | ICD-10-CM

## 2022-10-14 LAB — HEPATIC FUNCTION PANEL
ALT: 52 IU/L — ABNORMAL HIGH (ref 0–44)
AST: 36 IU/L (ref 0–40)
Albumin: 4.5 g/dL (ref 3.9–4.9)
Alkaline Phosphatase: 76 IU/L (ref 44–121)
Bilirubin Total: 0.4 mg/dL (ref 0.0–1.2)
Bilirubin, Direct: 0.15 mg/dL (ref 0.00–0.40)
Total Protein: 6.7 g/dL (ref 6.0–8.5)

## 2022-10-15 ENCOUNTER — Encounter: Payer: Self-pay | Admitting: Pharmacist

## 2022-10-15 DIAGNOSIS — E782 Mixed hyperlipidemia: Secondary | ICD-10-CM

## 2022-12-09 ENCOUNTER — Ambulatory Visit: Payer: 59 | Attending: Internal Medicine

## 2022-12-09 DIAGNOSIS — E782 Mixed hyperlipidemia: Secondary | ICD-10-CM

## 2022-12-10 LAB — HEPATIC FUNCTION PANEL
ALT: 24 [IU]/L (ref 0–44)
AST: 30 [IU]/L (ref 0–40)
Albumin: 4.2 g/dL (ref 3.9–4.9)
Alkaline Phosphatase: 71 [IU]/L (ref 44–121)
Bilirubin Total: 0.4 mg/dL (ref 0.0–1.2)
Bilirubin, Direct: 0.18 mg/dL (ref 0.00–0.40)
Total Protein: 6.5 g/dL (ref 6.0–8.5)

## 2022-12-10 LAB — LIPID PANEL
Chol/HDL Ratio: 1.8 ratio (ref 0.0–5.0)
Cholesterol, Total: 90 mg/dL — ABNORMAL LOW (ref 100–199)
HDL: 51 mg/dL (ref >39)
LDL Chol Calc (NIH): 27 mg/dL (ref 0–99)
Triglycerides: 47 mg/dL (ref 0–149)
VLDL Cholesterol Cal: 12 mg/dL (ref 5–40)

## 2023-03-05 ENCOUNTER — Telehealth: Payer: Self-pay

## 2023-03-05 ENCOUNTER — Other Ambulatory Visit (HOSPITAL_COMMUNITY): Payer: Self-pay

## 2023-03-05 NOTE — Telephone Encounter (Addendum)
 Pharmacy Patient Advocate Encounter   Received notification from CoverMyMeds that prior authorization for REPATHA  is required/requested.   Insurance verification completed.   The patient is insured through [facility] .   Per test claim: Refill too soon. PA is not needed at this time. Medication was filled 02/19/23. Next eligible fill date is 03/12/23.

## 2023-03-09 ENCOUNTER — Other Ambulatory Visit (HOSPITAL_COMMUNITY): Payer: Self-pay

## 2023-04-10 ENCOUNTER — Other Ambulatory Visit: Payer: Self-pay | Admitting: Internal Medicine

## 2023-05-05 ENCOUNTER — Other Ambulatory Visit: Payer: Self-pay | Admitting: Internal Medicine

## 2023-05-17 ENCOUNTER — Other Ambulatory Visit: Payer: Self-pay | Admitting: Internal Medicine

## 2023-06-07 ENCOUNTER — Other Ambulatory Visit: Payer: Self-pay | Admitting: Internal Medicine

## 2023-06-22 ENCOUNTER — Other Ambulatory Visit: Payer: Self-pay | Admitting: Internal Medicine

## 2023-06-30 ENCOUNTER — Other Ambulatory Visit: Payer: Self-pay | Admitting: Internal Medicine

## 2023-07-19 ENCOUNTER — Ambulatory Visit: Admitting: Cardiology

## 2023-08-04 ENCOUNTER — Other Ambulatory Visit: Payer: Self-pay | Admitting: Internal Medicine

## 2023-08-04 DIAGNOSIS — E782 Mixed hyperlipidemia: Secondary | ICD-10-CM

## 2023-08-04 DIAGNOSIS — I2510 Atherosclerotic heart disease of native coronary artery without angina pectoris: Secondary | ICD-10-CM

## 2023-08-23 NOTE — Progress Notes (Unsigned)
 Cardiology Office Note:    Date:  08/25/2023   ID:  Travis Mueller, DOB 06-07-1960, MRN [id number]  PCP:  Travis Mueller   [facility] Providers Cardiologist:  Travis MARLA Red, MD Cardiology APP:  Travis Jon Garre, PA     Referring MD: No ref. provider found   Chief Complaint  Patient presents with   Mixed hyperlipidemia   Follow-up    1 year    History of Present Illness:    Travis Mueller is a 63 y.o. male with a hx of palpitation, coronary calcification, and syncope.  Heart monitor following syncope showed no abnormal heart rhythms.  Coronary calcium  score 01/2022 revealed total coronary calcium  of 597 centimeters and 88%.  He was started on aspirin , continued on 80 mg atorvastatin, and 10 mg Zetia .  He was subsequently started on Repatha .  He presents today for routine cardiology follow-up. No cardiac issues. He continues to work at [employer], doing a physically demanding job.    Past Medical History:  Diagnosis Date   Elevated hemoglobin (HCC)    History of prostate cancer    Hypercholesterolemia    Palpitations     Past Surgical History:  Procedure Laterality Date   KIDNEY STONE SURGERY      Current Medications: Current Meds  Medication Sig   aspirin  EC 81 MG tablet Take 1 tablet (81 mg total) by mouth daily. Swallow whole.   Evolocumab  (REPATHA  SURECLICK) 140 MG/ML SOAJ INJECT 140 MG INTO THE SKIN EVERY 14 (FOURTEEN) DAYS.   omeprazole (PRILOSEC) 20 MG capsule Take 20 mg by mouth daily.   [DISCONTINUED] rosuvastatin  (CRESTOR ) 20 MG tablet TAKE 1 TABLET (20 MG TOTAL) BY MOUTH AS DIRECTED. TRANSFERRED PT TO SCHEDULING TO MAKE APPT WITH PROVIDER FOR FURTHER REFILLS     Allergies:   Atorvastatin   Social History   Socioeconomic History   Marital status: Married    Spouse name: Not on file   Number of children: Not on file   Years of education: Not on file   Highest education level: Not on file  Occupational History   Not on file  Tobacco Use    Smoking status: Never   Smokeless tobacco: Never  Vaping Use   Vaping status: Never Used  Substance and Sexual Activity   Alcohol use: Not Currently   Drug use: Not on file   Sexual activity: Not on file  Other Topics Concern   Not on file  Social History Narrative   Not on file   Social Drivers of Health   Financial Resource Strain: Not on file  Food Insecurity: Not on file  Transportation Needs: Not on file  Physical Activity: Not on file  Stress: Not on file  Social Connections: Not on file     Family History: The patient's family history includes Cancer - Prostate in his father and maternal aunt; Healthy in his daughter, mother, and son; Hypertension in his father and maternal aunt.  ROS:   Please see the history of present illness.     All other systems reviewed and are negative.  EKGs/Labs/Other Studies Reviewed:    The following studies were reviewed today:  EKG Interpretation Date/Time:  Wednesday August 25 2023 08:13:18 EDT Ventricular Rate:  58 PR Interval:  140 QRS Duration:  92 QT Interval:  444 QTC Calculation: 435 R Axis:   38  Text Interpretation: Sinus bradycardia No previous ECGs available Confirmed by Travis Jon ([id number]) on 08/25/2023 8:17:44 AM    Recent Labs:  12/09/2022: ALT 24  Recent Lipid Panel    Component Value Date/Time   CHOL 90 (L) 12/09/2022 0948   TRIG 47 12/09/2022 0948   HDL 51 12/09/2022 0948   CHOLHDL 1.8 12/09/2022 0948   LDLCALC 27 12/09/2022 0948     Risk Assessment/Calculations:                Physical Exam:    VS:  BP 135/75 (BP Location: Left Arm, Patient Position: Sitting, Cuff Size: Normal)   Pulse (!) 55   Resp 16   Ht 5' 6 (1.676 m)   Wt 146 lb 12.8 oz (66.6 kg)   SpO2 96%   BMI 23.69 kg/m     Wt Readings from Last 3 Encounters:  08/25/23 146 lb 12.8 oz (66.6 kg)  02/12/22 147 lb 12.8 oz (67 kg)  01/28/21 150 lb 3.2 oz (68.1 kg)     GEN:  Well nourished, well developed in no acute  distress HEENT: Normal NECK: No JVD; No carotid bruits LYMPHATICS: No lymphadenopathy CARDIAC: RRR, no murmurs, rubs, gallops RESPIRATORY:  Clear to auscultation without rales, wheezing or rhonchi  ABDOMEN: Soft, non-tender, non-distended MUSCULOSKELETAL:  No edema; No deformity  SKIN: Warm and dry NEUROLOGIC:  Alert and oriented x 3 PSYCHIATRIC:  Normal affect   ASSESSMENT:    1. Coronary artery calcification seen on CAT scan   2. Hyperlipidemia LDL goal <70   3. Mixed hyperlipidemia   4. Sinus bradycardia    PLAN:    In order of problems listed above:  Coronary artery calcification Hyperlipidemia with LDL < 70 -- continue Repatha , 20 mg Crestor  - LPA 290 -- no longer on zetia  -- restart 81 mg aspirin   12/09/2022: Cholesterol, Total 90; HDL 51; LDL Chol Calc (NIH) 27; Triglycerides 47 - recheck labs due 11/2023   Sinus bradycardia - HR In the 50s - asymptomatic   Follow up in 1 year.      Medication Adjustments/Labs and Tests Ordered: Current medicines are reviewed at length with the patient today.  Concerns regarding medicines are outlined above.  Orders Placed This Encounter  Procedures   EKG 12-Lead   Meds ordered this encounter  Medications   rosuvastatin  (CRESTOR ) 20 MG tablet    Sig: Take 1 tablet (20 mg total) by mouth as directed. Transferred pt to scheduling to make appt with provider for further refills    Dispense:  90 tablet    Refill:  3   aspirin  EC 81 MG tablet    Sig: Take 1 tablet (81 mg total) by mouth daily. Swallow whole.    Patient Instructions  Medication Instructions:  Your physician has recommended you make the following change in your medication:  START ASPIRIN  81 MG DAILY.  *If you need a refill on your cardiac medications before your next appointment, please call your pharmacy*  Lab Work: NONE If you have labs (blood work) drawn today and your tests are completely normal, you will receive your results only by: MyChart  Message (if you have MyChart) OR A paper copy in the mail If you have any lab test that is abnormal or we need to change your treatment, we will call you to review the results.  Testing/Procedures: NONE  Follow-Up: At [facility], you and your health needs are our priority.  As part of our continuing mission to provide you with exceptional heart care, our providers are all part of one team.  This team includes your primary Cardiologist (physician) and  Advanced Practice Providers or APPs (Physician Assistants and Nurse Practitioners) who all work together to provide you with the care you need, when you need it.  Your next appointment:   1 year  Provider:   Arun K Thukkani, MD or Jon Hails, PA-C We recommend signing up for the patient portal called MyChart.  Sign up information is provided on this After Visit Summary.  MyChart is used to connect with patients for Virtual Visits (Telemedicine).  Patients are able to view lab/test results, encounter notes, upcoming appointments, etc.  Non-urgent messages can be sent to your provider as well.   To learn more about what you can do with MyChart, go to [email or web address].          Signed, Jon Nat Hails, [place]  08/25/2023 8:51 AM    [facility]

## 2023-08-25 ENCOUNTER — Other Ambulatory Visit: Payer: Self-pay

## 2023-08-25 ENCOUNTER — Other Ambulatory Visit (HOSPITAL_COMMUNITY): Payer: Self-pay

## 2023-08-25 ENCOUNTER — Ambulatory Visit: Attending: Cardiology | Admitting: Physician Assistant

## 2023-08-25 ENCOUNTER — Encounter: Payer: Self-pay | Admitting: Physician Assistant

## 2023-08-25 VITALS — BP 135/75 | HR 55 | Resp 16 | Ht 66.0 in | Wt 146.8 lb

## 2023-08-25 DIAGNOSIS — I2510 Atherosclerotic heart disease of native coronary artery without angina pectoris: Secondary | ICD-10-CM

## 2023-08-25 DIAGNOSIS — E782 Mixed hyperlipidemia: Secondary | ICD-10-CM

## 2023-08-25 DIAGNOSIS — R001 Bradycardia, unspecified: Secondary | ICD-10-CM | POA: Diagnosis not present

## 2023-08-25 DIAGNOSIS — E785 Hyperlipidemia, unspecified: Secondary | ICD-10-CM

## 2023-08-25 MED ORDER — ROSUVASTATIN CALCIUM 20 MG PO TABS: 20.0000 mg | ORAL_TABLET | ORAL | 3 refills | Status: AC

## 2023-08-25 MED ORDER — ASPIRIN 81 MG PO TBEC: 81.0000 mg | DELAYED_RELEASE_TABLET | Freq: Every day | ORAL | Status: AC

## 2023-08-25 MED ORDER — REPATHA SURECLICK 140 MG/ML ~~LOC~~ SOAJ
1.0000 mL | SUBCUTANEOUS | 3 refills | Status: DC
  Filled 2023-08-25 – 2023-09-08 (×4): qty 2, 28d supply, fill #0

## 2023-08-25 NOTE — Patient Instructions (Addendum)
 Medication Instructions:  Your physician has recommended you make the following change in your medication:  START ASPIRIN  81 MG DAILY.  *If you need a refill on your cardiac medications before your next appointment, please call your pharmacy*  Lab Work: NONE If you have labs (blood work) drawn today and your tests are completely normal, you will receive your results only by: MyChart Message (if you have MyChart) OR A paper copy in the mail If you have any lab test that is abnormal or we need to change your treatment, we will call you to review the results.  Testing/Procedures: NONE  Follow-Up: At [facility], you and your health needs are our priority.  As part of our continuing mission to provide you with exceptional heart care, our providers are all part of one team.  This team includes your primary Cardiologist (physician) and Advanced Practice Providers or APPs (Physician Assistants and Nurse Practitioners) who all work together to provide you with the care you need, when you need it.  Your next appointment:   1 year  Provider:   Arun K Thukkani, MD or Jon Hails, PA-C We recommend signing up for the patient portal called MyChart.  Sign up information is provided on this After Visit Summary.  MyChart is used to connect with patients for Virtual Visits (Telemedicine).  Patients are able to view lab/test results, encounter notes, upcoming appointments, etc.  Non-urgent messages can be sent to your provider as well.   To learn more about what you can do with MyChart, go to [email or web address].

## 2023-09-06 ENCOUNTER — Other Ambulatory Visit (HOSPITAL_COMMUNITY): Payer: Self-pay

## 2023-09-08 ENCOUNTER — Other Ambulatory Visit: Payer: Self-pay

## 2023-09-08 ENCOUNTER — Other Ambulatory Visit (HOSPITAL_COMMUNITY): Payer: Self-pay

## 2023-09-09 ENCOUNTER — Other Ambulatory Visit (HOSPITAL_COMMUNITY): Payer: Self-pay

## 2024-01-01 ENCOUNTER — Other Ambulatory Visit: Payer: Self-pay | Admitting: Internal Medicine

## 2024-01-01 DIAGNOSIS — I2510 Atherosclerotic heart disease of native coronary artery without angina pectoris: Secondary | ICD-10-CM

## 2024-01-01 DIAGNOSIS — E782 Mixed hyperlipidemia: Secondary | ICD-10-CM
